# Patient Record
Sex: Female | Born: 1965 | Race: Asian | Hispanic: No | Marital: Married | Smoking: Never smoker
Health system: Southern US, Community
[De-identification: ages and names within clinical notes are randomized; demographics above are authoritative.]

## PROBLEM LIST (undated history)

## (undated) DIAGNOSIS — M8580 Other specified disorders of bone density and structure, unspecified site: Secondary | ICD-10-CM

## (undated) DIAGNOSIS — E785 Hyperlipidemia, unspecified: Secondary | ICD-10-CM

## (undated) DIAGNOSIS — M810 Age-related osteoporosis without current pathological fracture: Secondary | ICD-10-CM

## (undated) HISTORY — PX: EYE SURGERY: SHX253

## (undated) HISTORY — DX: Age-related osteoporosis without current pathological fracture: M81.0

## (undated) HISTORY — DX: Other specified disorders of bone density and structure, unspecified site: M85.80

---

## 2016-03-07 ENCOUNTER — Encounter: Payer: Self-pay | Admitting: Obstetrics & Gynecology

## 2016-06-17 ENCOUNTER — Emergency Department (HOSPITAL_COMMUNITY): Payer: BLUE CROSS/BLUE SHIELD

## 2016-06-17 ENCOUNTER — Encounter (HOSPITAL_COMMUNITY): Payer: Self-pay | Admitting: *Deleted

## 2016-06-17 ENCOUNTER — Emergency Department (HOSPITAL_COMMUNITY)
Admission: EM | Admit: 2016-06-17 | Discharge: 2016-06-17 | Disposition: A | Discharge status: Home / Self Care | Payer: BLUE CROSS/BLUE SHIELD | Attending: Emergency Medicine | Admitting: Emergency Medicine

## 2016-06-17 DIAGNOSIS — Y999 Unspecified external cause status: Secondary | ICD-10-CM | POA: Diagnosis not present

## 2016-06-17 DIAGNOSIS — S299XXA Unspecified injury of thorax, initial encounter: Secondary | ICD-10-CM | POA: Diagnosis present

## 2016-06-17 DIAGNOSIS — S20219A Contusion of unspecified front wall of thorax, initial encounter: Secondary | ICD-10-CM | POA: Insufficient documentation

## 2016-06-17 DIAGNOSIS — Y92410 Unspecified street and highway as the place of occurrence of the external cause: Secondary | ICD-10-CM | POA: Insufficient documentation

## 2016-06-17 DIAGNOSIS — Y939 Activity, unspecified: Secondary | ICD-10-CM | POA: Diagnosis not present

## 2016-06-17 HISTORY — DX: Hyperlipidemia, unspecified: E78.5

## 2016-06-17 MED ORDER — IBUPROFEN 400 MG PO TABS
400.0000 mg | ORAL_TABLET | Freq: Once | ORAL | Status: AC
  Administered 2016-06-17: 400 mg via ORAL
  Filled 2016-06-17: qty 1
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
## 2016-08-20 ENCOUNTER — Encounter: Payer: Self-pay | Admitting: Obstetrics & Gynecology

## 2017-03-04 ENCOUNTER — Encounter: Payer: Self-pay | Admitting: Obstetrics & Gynecology

## 2017-08-12 ENCOUNTER — Encounter: Payer: Self-pay | Admitting: Obstetrics & Gynecology

## 2017-08-12 ENCOUNTER — Telehealth: Payer: Self-pay | Admitting: *Deleted

## 2017-08-12 ENCOUNTER — Ambulatory Visit: Payer: BLUE CROSS/BLUE SHIELD | Admitting: Obstetrics & Gynecology

## 2017-08-12 VITALS — BP 120/76 | Ht 59.5 in | Wt 105.0 lb

## 2017-08-12 DIAGNOSIS — Z1151 Encounter for screening for human papillomavirus (HPV): Secondary | ICD-10-CM

## 2017-08-12 DIAGNOSIS — Z1211 Encounter for screening for malignant neoplasm of colon: Secondary | ICD-10-CM

## 2017-08-12 DIAGNOSIS — Z780 Asymptomatic menopausal state: Secondary | ICD-10-CM

## 2017-08-12 DIAGNOSIS — Z13820 Encounter for screening for osteoporosis: Secondary | ICD-10-CM | POA: Diagnosis not present

## 2017-08-12 DIAGNOSIS — Z01419 Encounter for gynecological examination (general) (routine) without abnormal findings: Secondary | ICD-10-CM

## 2017-08-12 NOTE — Progress Notes (Signed)
Karen Torres [birth year] [id number]   History:    52 y.o. G0  Married.  Works in [employer], [employer] (medical).  RP:  Established patient presenting for annual gyn exam   HPI: Menopause for about 4 years.  On no hormone replacement therapy.  No postmenopausal bleeding.  Normal vaginal secretions.  No pain with intercourse.  Urine and bowel movements normal.  Breasts normal.  Body mass index 20.85.  Walks every day.  Healthy nutrition.  Will follow up here for fasting health labs.  Past medical history,surgical history, family history and social history were all reviewed and documented in the EPIC chart.  Gynecologic History No LMP recorded. Patient is postmenopausal. Contraception: post menopausal status Last Pap: 2018? Results were: normal per patient, will obtain records from [place] mammogram: 02/2017. Results were: Probably benign, f/u scheduled 09/25/2017 Bone Density: Never, will schedule here now Colonoscopy: Never, will refer to [facility]  Obstetric History OB History  Gravida Para Term Preterm AB Living  0 0 0 0 0 0  SAB TAB Ectopic Multiple Live Births  0 0 0 0 0     ROS: A ROS was performed and pertinent positives and negatives are included in the history.  GENERAL: No fevers or chills. HEENT: No change in vision, no earache, sore throat or sinus congestion. NECK: No pain or stiffness. CARDIOVASCULAR: No chest pain or pressure. No palpitations. PULMONARY: No shortness of breath, cough or wheeze. GASTROINTESTINAL: No abdominal pain, nausea, vomiting or diarrhea, melena or bright red blood per rectum. GENITOURINARY: No urinary frequency, urgency, hesitancy or dysuria. MUSCULOSKELETAL: No joint or muscle pain, no back pain, no recent trauma. DERMATOLOGIC: No rash, no itching, no lesions. ENDOCRINE: No polyuria, polydipsia, no heat or cold intolerance. No recent change in weight. HEMATOLOGICAL: No anemia or easy bruising or bleeding. NEUROLOGIC: No headache, seizures, numbness, tingling or  weakness. PSYCHIATRIC: No depression, no loss of interest in normal activity or change in sleep pattern.     Exam:   BP 120/76   Ht 4' 11.5" (1.511 m)   Wt 105 lb (47.6 kg)   BMI 20.85 kg/m   Body mass index is 20.85 kg/m.  General appearance : Well developed well nourished female. No acute distress HEENT: Eyes: no retinal hemorrhage or exudates,  Neck supple, trachea midline, no carotid bruits, no thyroidmegaly Lungs: Clear to auscultation, no rhonchi or wheezes, or rib retractions  Heart: Regular rate and rhythm, no murmurs or gallops Breast:Examined in sitting and supine position were symmetrical in appearance, no palpable masses or tenderness,  no skin retraction, no nipple inversion, no nipple discharge, no skin discoloration, no axillary or supraclavicular lymphadenopathy Abdomen: no palpable masses or tenderness, no rebound or guarding Extremities: no edema or skin discoloration or tenderness  Pelvic: Vulva: Normal             Vagina: No gross lesions or discharge  Cervix: No gross lesions or discharge.  Pap/HPV HR done.  Uterus  AV, normal size, shape and consistency, non-tender and mobile  Adnexa  Without masses or tenderness  Anus: Normal   Assessment/Plan:  52 y.o. female for annual exam   1. Encounter for routine gynecological examination with Papanicolaou smear of cervix Normal gynecologic exam and menopause.  Pap test with high risk HPV done today.  Breast exam normal.  Last screening mammogram in January 2019 showed probably benign results.  Follow-up mammogram at 6 months is scheduled for September 25, 2017.  Will organize first screening colonoscopy with gastroenterology.  Follow-up here for fasting health labs.  Continue with regular fitness and healthy nutrition. - CBC; Future - Comp Met (CMET); Future - TSH; Future - VITAMIN D 25 Hydroxy (Vit-D Deficiency, Fractures); Future - Lipid panel; Future  2. Menopause present Well on no hormone replacement therapy.   No postmenopausal bleeding.    3. Screening for osteoporosis Will follow up here for bone density.  Recommend vitamin D supplements, calcium rich nutrition and regular weightbearing physical activity. - DG Bone Density; Future  Other orders - Multiple Vitamin (MULTIVITAMIN) tablet; Take 1 tablet by mouth daily.  Princess Bruins MD, 8:49 AM 08/12/2017

## 2017-08-12 NOTE — Telephone Encounter (Signed)
Referral placed at [facility] to schedule, they will call her to do so.

## 2017-08-12 NOTE — Telephone Encounter (Signed)
-----   Message from Genia DelMarie-Lyne Lavoie, MD sent at 08/12/2017  9:10 AM EDT ----- Regarding: Refer to [facility] Screening Colonoscopy

## 2017-08-12 NOTE — Addendum Note (Signed)
Addended by: Berna SpareASTILLO, Verina Galeno A on: 08/12/2017 09:30 AM   Modules accepted: Orders

## 2017-08-12 NOTE — Patient Instructions (Signed)
1. Encounter for routine gynecological examination with Papanicolaou smear of cervix Normal gynecologic exam and menopause.  Pap test with high risk HPV done today.  Breast exam normal.  Last screening mammogram in January 2019 showed probably benign results.  Follow-up mammogram at 6 months is scheduled for September 25, 2017.  Will organize first screening colonoscopy with gastroenterology.  Follow-up here for fasting health labs.  Continue with regular fitness and healthy nutrition. - CBC; Future - Comp Met (CMET); Future - TSH; Future - VITAMIN D 25 Hydroxy (Vit-D Deficiency, Fractures); Future - Lipid panel; Future  2. Menopause present Well on no hormone replacement therapy.  No postmenopausal bleeding.    3. Screening for osteoporosis Will follow up here for bone density.  Recommend vitamin D supplements, calcium rich nutrition and regular weightbearing physical activity. - DG Bone Density; Future  Other orders - Multiple Vitamin (MULTIVITAMIN) tablet; Take 1 tablet by mouth daily.  Verenise, it was a pleasure seeing you today!  I will inform you of your results as soon as they are available. 

## 2017-08-14 LAB — PAP, TP IMAGING W/ HPV RNA, RFLX HPV TYPE 16,18/45: HPV DNA HIGH RISK: NOT DETECTED

## 2017-08-14 NOTE — Telephone Encounter (Signed)
Blende said patient will call back to schedule.

## 2017-09-12 ENCOUNTER — Encounter: Payer: Self-pay | Admitting: *Deleted

## 2017-09-15 ENCOUNTER — Other Ambulatory Visit: Payer: Self-pay | Admitting: Gynecology

## 2017-09-15 DIAGNOSIS — Z13820 Encounter for screening for osteoporosis: Secondary | ICD-10-CM

## 2017-09-16 ENCOUNTER — Other Ambulatory Visit: Payer: BLUE CROSS/BLUE SHIELD

## 2017-09-25 ENCOUNTER — Encounter: Payer: Self-pay | Admitting: Obstetrics & Gynecology

## 2017-09-25 ENCOUNTER — Ambulatory Visit (INDEPENDENT_AMBULATORY_CARE_PROVIDER_SITE_OTHER): Payer: BLUE CROSS/BLUE SHIELD

## 2017-09-25 ENCOUNTER — Other Ambulatory Visit: Payer: Self-pay | Admitting: Gynecology

## 2017-09-25 ENCOUNTER — Other Ambulatory Visit: Payer: BLUE CROSS/BLUE SHIELD

## 2017-09-25 DIAGNOSIS — M8589 Other specified disorders of bone density and structure, multiple sites: Secondary | ICD-10-CM

## 2017-09-25 DIAGNOSIS — Z01419 Encounter for gynecological examination (general) (routine) without abnormal findings: Secondary | ICD-10-CM

## 2017-09-25 DIAGNOSIS — M8580 Other specified disorders of bone density and structure, unspecified site: Secondary | ICD-10-CM

## 2017-09-25 DIAGNOSIS — Z13820 Encounter for screening for osteoporosis: Secondary | ICD-10-CM

## 2017-09-25 HISTORY — DX: Other specified disorders of bone density and structure, unspecified site: M85.80

## 2017-09-26 ENCOUNTER — Encounter: Payer: Self-pay | Admitting: Gynecology

## 2017-09-26 LAB — COMPREHENSIVE METABOLIC PANEL
AG RATIO: 1.8 (calc) (ref 1.0–2.5)
ALT: 8 U/L (ref 6–29)
AST: 29 U/L (ref 10–35)
Albumin: 4.6 g/dL (ref 3.6–5.1)
Alkaline phosphatase (APISO): 78 U/L (ref 33–130)
BILIRUBIN TOTAL: 0.7 mg/dL (ref 0.2–1.2)
BUN: 12 mg/dL (ref 7–25)
CALCIUM: 9.8 mg/dL (ref 8.6–10.4)
CO2: 24 mmol/L (ref 20–32)
Chloride: 105 mmol/L (ref 98–110)
Creat: 0.59 mg/dL (ref 0.50–1.05)
GLUCOSE: 80 mg/dL (ref 65–99)
Globulin: 2.6 g/dL (calc) (ref 1.9–3.7)
Potassium: 4.7 mmol/L (ref 3.5–5.3)
SODIUM: 140 mmol/L (ref 135–146)
TOTAL PROTEIN: 7.2 g/dL (ref 6.1–8.1)

## 2017-09-26 LAB — LIPID PANEL
CHOL/HDL RATIO: 3.7 (calc) (ref <5.0)
CHOLESTEROL: 231 mg/dL — AB (ref <200)
HDL: 62 mg/dL (ref >50)
LDL CHOLESTEROL (CALC): 144 mg/dL — AB
Non-HDL Cholesterol (Calc): 169 mg/dL (calc) — ABNORMAL HIGH (ref <130)
Triglycerides: 130 mg/dL (ref <150)

## 2017-09-26 LAB — CBC
HCT: 37.9 % (ref 35.0–45.0)
Hemoglobin: 12.8 g/dL (ref 11.7–15.5)
MCH: 32.2 pg (ref 27.0–33.0)
MCHC: 33.8 g/dL (ref 32.0–36.0)
MCV: 95.2 fL (ref 80.0–100.0)
MPV: 11.2 fL (ref 7.5–12.5)
Platelets: 201 Thousand/uL (ref 140–400)
RBC: 3.98 Million/uL (ref 3.80–5.10)
RDW: 11.8 % (ref 11.0–15.0)
WBC: 4.7 Thousand/uL (ref 3.8–10.8)

## 2017-09-26 LAB — VITAMIN D 25 HYDROXY (VIT D DEFICIENCY, FRACTURES): Vit D, 25-Hydroxy: 23 ng/mL — ABNORMAL LOW (ref 30–100)

## 2017-09-26 LAB — TSH: TSH: 1.45 m[IU]/L

## 2017-10-17 ENCOUNTER — Other Ambulatory Visit: Payer: Self-pay | Admitting: Obstetrics & Gynecology

## 2017-10-17 DIAGNOSIS — E559 Vitamin D deficiency, unspecified: Secondary | ICD-10-CM

## 2017-10-17 MED ORDER — VITAMIN D (ERGOCALCIFEROL) 1.25 MG (50000 UNIT) PO CAPS: 50000.0000 [IU] | ORAL_CAPSULE | ORAL | 0 refills | Status: DC
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
## 2018-01-03 ENCOUNTER — Other Ambulatory Visit: Payer: Self-pay | Admitting: Obstetrics & Gynecology

## 2018-01-03 DIAGNOSIS — E559 Vitamin D deficiency, unspecified: Secondary | ICD-10-CM

## 2018-01-06 ENCOUNTER — Other Ambulatory Visit: Payer: BLUE CROSS/BLUE SHIELD

## 2018-01-06 DIAGNOSIS — E559 Vitamin D deficiency, unspecified: Secondary | ICD-10-CM

## 2018-01-07 LAB — VITAMIN D 25 HYDROXY (VIT D DEFICIENCY, FRACTURES): VIT D 25 HYDROXY: 33 ng/mL (ref 30–100)

## 2018-09-25 ENCOUNTER — Ambulatory Visit (INDEPENDENT_AMBULATORY_CARE_PROVIDER_SITE_OTHER): Payer: BC Managed Care – PPO | Admitting: Obstetrics & Gynecology

## 2018-09-25 ENCOUNTER — Encounter: Payer: Self-pay | Admitting: Obstetrics & Gynecology

## 2018-09-25 ENCOUNTER — Other Ambulatory Visit: Payer: Self-pay

## 2018-09-25 VITALS — Ht 59.5 in | Wt 108.0 lb

## 2018-09-25 DIAGNOSIS — Z780 Asymptomatic menopausal state: Secondary | ICD-10-CM | POA: Diagnosis not present

## 2018-09-25 DIAGNOSIS — Z01419 Encounter for gynecological examination (general) (routine) without abnormal findings: Secondary | ICD-10-CM

## 2018-09-25 NOTE — Progress Notes (Signed)
Karen DancerMaryanti Torres 15-Jan-1966 [id number]   History:    53 y.o. G0 Married.  Works in [employer].  RP:  Established patient presenting for annual gyn exam   HPI: Menopause for about 5 years.  On no hormone replacement therapy.  No postmenopausal bleeding.  Normal vaginal secretions.  Some dryness and pain with intercourse.  Urine and bowel movements normal.  Breasts normal.  Body mass index 21.45.  Walks every day.  Healthy nutrition.  Will establish with a Fam MD for Health Labs.  No colonoscopy done yet.   Past medical history,surgical history, family history and social history were all reviewed and documented in the EPIC chart.  Gynecologic History No LMP recorded. Patient is postmenopausal. Contraception: post menopausal status Last Pap: 07/2017. Results were: Negative/HPV HR Neg Last mammogram: 09/2017. Results were: Benign Bone Density: Never Colonoscopy: Will schedule referral with GastroEnterologist.  Obstetric History OB History  Gravida Para Term Preterm AB Living  0 0 0 0 0 0  SAB TAB Ectopic Multiple Live Births  0 0 0 0 0     ROS: A ROS was performed and pertinent positives and negatives are included in the history.  GENERAL: No fevers or chills. HEENT: No change in vision, no earache, sore throat or sinus congestion. NECK: No pain or stiffness. CARDIOVASCULAR: No chest pain or pressure. No palpitations. PULMONARY: No shortness of breath, cough or wheeze. GASTROINTESTINAL: No abdominal pain, nausea, vomiting or diarrhea, melena or bright red blood per rectum. GENITOURINARY: No urinary frequency, urgency, hesitancy or dysuria. MUSCULOSKELETAL: No joint or muscle pain, no back pain, no recent trauma. DERMATOLOGIC: No rash, no itching, no lesions. ENDOCRINE: No polyuria, polydipsia, no heat or cold intolerance. No recent change in weight. HEMATOLOGICAL: No anemia or easy bruising or bleeding. NEUROLOGIC: No headache, seizures, numbness, tingling or weakness. PSYCHIATRIC: No  depression, no loss of interest in normal activity or change in sleep pattern.     Exam:   Ht 4' 11.5" (1.511 m)   Wt 108 lb (49 kg)   BMI 21.45 kg/m   Body mass index is 21.45 kg/m.  General appearance : Well developed well nourished female. No acute distress HEENT: Eyes: no retinal hemorrhage or exudates,  Neck supple, trachea midline, no carotid bruits, no thyroidmegaly Lungs: Clear to auscultation, no rhonchi or wheezes, or rib retractions  Heart: Regular rate and rhythm, no murmurs or gallops Breast:Examined in sitting and supine position were symmetrical in appearance, no palpable masses or tenderness,  no skin retraction, no nipple inversion, no nipple discharge, no skin discoloration, no axillary or supraclavicular lymphadenopathy Abdomen: no palpable masses or tenderness, no rebound or guarding Extremities: no edema or skin discoloration or tenderness  Pelvic: Vulva: Normal             Vagina: No gross lesions or discharge  Cervix: No gross lesions or discharge  Uterus  AV, normal size, shape and consistency, non-tender and mobile  Adnexa  Without masses or tenderness  Anus: Normal   Assessment/Plan:  53 y.o. female for annual exam   1. Well female exam with routine gynecological exam Normal gynecologic exam and menopause.  Pap test in June 2019 was negative with negative high-risk HPV, no indication to repeat this year.  Breast exam normal.  Screening mammogram August 2019 was benign, patient will schedule now.  No colonoscopy done yet, will refer to gastroenterology to organize.  Will establish with a family physician and do health labs with family physician.  Good body mass  index at 21.45.  Continue with fitness and healthy nutrition.  2. Postmenopause Well on no hormone replacement therapy.  No postmenopausal bleeding.  Continue with vitamin D supplements, calcium intake of 1200 mg daily and regular weightbearing physical activities.  Other orders - cholecalciferol  (VITAMIN D3) 25 MCG (1000 UT) tablet; Take 1,000 Units by mouth daily.  Princess Bruins MD, 11:07 AM 09/25/2018

## 2018-09-25 NOTE — Patient Instructions (Addendum)
1. Well female exam with routine gynecological exam Normal gynecologic exam and menopause.  Pap test in June 2019 was negative with negative high-risk HPV, no indication to repeat this year.  Breast exam normal.  Screening mammogram August 2019 was benign, patient will schedule now.  No colonoscopy done yet, will refer to gastroenterology to organize.  Will establish with a family physician and do health labs with family physician.  Good body mass index at 21.45.  Continue with fitness and healthy nutrition.  2. Postmenopause Well on no hormone replacement therapy.  No postmenopausal bleeding.  Continue with vitamin D supplements, calcium intake of 1200 mg daily and regular weightbearing physical activities.  Other orders - cholecalciferol (VITAMIN D3) 25 MCG (1000 UT) tablet; Take 1,000 Units by mouth daily.  Alaney, it was a pleasure seeing you today!

## 2018-09-29 ENCOUNTER — Telehealth: Payer: Self-pay | Admitting: *Deleted

## 2018-09-29 NOTE — Telephone Encounter (Signed)
-----   Message from Princess Bruins, MD sent at 09/25/2018 11:19 AM EDT ----- Regarding: Refer to [facility] Screening Colonoscopy.    Please also suggest a few Fam MD possibilities, would like to establish care.

## 2018-09-29 NOTE — Telephone Encounter (Signed)
Left message for pt to call to provide name for the below.

## 2018-10-01 ENCOUNTER — Encounter: Payer: Self-pay | Admitting: Obstetrics & Gynecology

## 2018-11-18 ENCOUNTER — Encounter: Payer: Self-pay | Admitting: Gynecology

## 2019-04-08 ENCOUNTER — Encounter: Payer: Self-pay | Admitting: Obstetrics & Gynecology

## 2019-09-30 ENCOUNTER — Encounter: Payer: Self-pay | Admitting: Obstetrics & Gynecology

## 2019-09-30 ENCOUNTER — Other Ambulatory Visit: Payer: Self-pay

## 2019-09-30 ENCOUNTER — Ambulatory Visit (INDEPENDENT_AMBULATORY_CARE_PROVIDER_SITE_OTHER): Payer: BC Managed Care – PPO | Admitting: Obstetrics & Gynecology

## 2019-09-30 VITALS — BP 130/70 | Ht 59.5 in | Wt 110.0 lb

## 2019-09-30 DIAGNOSIS — M8589 Other specified disorders of bone density and structure, multiple sites: Secondary | ICD-10-CM

## 2019-09-30 DIAGNOSIS — Z01419 Encounter for gynecological examination (general) (routine) without abnormal findings: Secondary | ICD-10-CM

## 2019-09-30 DIAGNOSIS — Z780 Asymptomatic menopausal state: Secondary | ICD-10-CM | POA: Diagnosis not present

## 2019-09-30 DIAGNOSIS — N870 Mild cervical dysplasia: Secondary | ICD-10-CM

## 2019-09-30 NOTE — Progress Notes (Signed)
Karen Torres 1965/10/27 [id number]   History:    54 y.o. G0Married. Works in Scientist, research (medical).  [id number] presenting for annual gyn exam   [id number] for about 6 years. On no hormone replacement therapy. No postmenopausal bleeding. Normal vaginal secretions. No pain with IC. Urine and bowel movements normal. Breasts normal. Body mass index 21.85. Walks every day. Healthy nutrition. Will f/u here for fasting Health Labs.  Will refer to [facility] for Colonoscopy.   Past medical history,surgical history, family history and social history were all reviewed and documented in the EPIC chart.  Gynecologic History No LMP recorded. Patient is postmenopausal.  Obstetric History OB History  Gravida Para Term Preterm AB Living  0 0 0 0 0 0  SAB TAB Ectopic Multiple Live Births  0 0 0 0 0     ROS: A ROS was performed and pertinent positives and negatives are included in the history.  GENERAL: No fevers or chills. HEENT: No change in vision, no earache, sore throat or sinus congestion. NECK: No pain or stiffness. CARDIOVASCULAR: No chest pain or pressure. No palpitations. PULMONARY: No shortness of breath, cough or wheeze. GASTROINTESTINAL: No abdominal pain, nausea, vomiting or diarrhea, melena or bright red blood per rectum. GENITOURINARY: No urinary frequency, urgency, hesitancy or dysuria. MUSCULOSKELETAL: No joint or muscle pain, no back pain, no recent trauma. DERMATOLOGIC: No rash, no itching, no lesions. ENDOCRINE: No polyuria, polydipsia, no heat or cold intolerance. No recent change in weight. HEMATOLOGICAL: No anemia or easy bruising or bleeding. NEUROLOGIC: No headache, seizures, numbness, tingling or weakness. PSYCHIATRIC: No depression, no loss of interest in normal activity or change in sleep pattern.     Exam:   BP 130/70   Ht 4' 11.5" (1.511 m)   Wt 110 lb (49.9 kg)   BMI 21.85 kg/m   Body mass index is 21.85 kg/m.  General appearance : Well  developed well nourished female. No acute distress HEENT: Eyes: no retinal hemorrhage or exudates,  Neck supple, trachea midline, no carotid bruits, no thyroidmegaly Lungs: Clear to auscultation, no rhonchi or wheezes, or rib retractions  Heart: Regular rate and rhythm, no murmurs or gallops Breast:Examined in sitting and supine position were symmetrical in appearance, no palpable masses or tenderness,  no skin retraction, no nipple inversion, no nipple discharge, no skin discoloration, no axillary or supraclavicular lymphadenopathy Abdomen: no palpable masses or tenderness, no rebound or guarding Extremities: no edema or skin discoloration or tenderness  Pelvic: Vulva: Normal             Vagina: No gross lesions or discharge  Cervix: No gross lesions or discharge.  Pap reflex done.  Uterus  AV, normal size, shape and consistency, non-tender and mobile  Adnexa  Without masses or tenderness  Anus: Normal   Assessment/Plan:  55 y.o. female for annual exam   1. Encounter for routine gynecological examination with Papanicolaou smear of cervix Normal gyn exam in Menopause.  Pap reflex done today.  Breast exam normal.  Screening Mammo Neg 03/2019.  Refer to [facility] for [employer]  Fasting Health Labs here. - CBC; Future - Comp Met (CMET); Future - TSH; Future - VITAMIN D 25 Hydroxy (Vit-D Deficiency, Fractures); Future - Lipid panel; Future  2. Dysplasia of cervix, low grade (CIN 1) Pap test repeated today.  3. Postmenopause Well on no HRT.  No PMB.  4. Osteopenia of multiple sites BD with Osteopenia at multiple sites in 09/2017, will repeat BD here now.  Vitamin D supplements, calcium  intake of 1200 mg daily and regular weightbearing physical activities. - DG Bone Density; Future  Princess Bruins MD, 9:23 AM 09/30/2019

## 2019-10-01 LAB — PAP IG W/ RFLX HPV ASCU

## 2019-11-09 ENCOUNTER — Encounter: Payer: Self-pay | Admitting: Obstetrics & Gynecology

## 2019-11-16 ENCOUNTER — Other Ambulatory Visit: Payer: BC Managed Care – PPO

## 2019-11-16 ENCOUNTER — Other Ambulatory Visit: Payer: Self-pay

## 2019-11-16 DIAGNOSIS — Z01419 Encounter for gynecological examination (general) (routine) without abnormal findings: Secondary | ICD-10-CM

## 2019-11-17 LAB — CBC
HCT: 39.8 % (ref 35.0–45.0)
Hemoglobin: 13.2 g/dL (ref 11.7–15.5)
MCH: 32.5 pg (ref 27.0–33.0)
MCHC: 33.2 g/dL (ref 32.0–36.0)
MCV: 98 fL (ref 80.0–100.0)
MPV: 11.1 fL (ref 7.5–12.5)
Platelets: 216 10*3/uL (ref 140–400)
RBC: 4.06 10*6/uL (ref 3.80–5.10)
RDW: 11.8 % (ref 11.0–15.0)
WBC: 4.4 10*3/uL (ref 3.8–10.8)

## 2019-11-17 LAB — COMPREHENSIVE METABOLIC PANEL
AG Ratio: 1.6 (calc) (ref 1.0–2.5)
ALT: 8 U/L (ref 6–29)
AST: 22 U/L (ref 10–35)
Albumin: 4.5 g/dL (ref 3.6–5.1)
Alkaline phosphatase (APISO): 83 U/L (ref 37–153)
BUN: 14 mg/dL (ref 7–25)
CO2: 28 mmol/L (ref 20–32)
Calcium: 9.8 mg/dL (ref 8.6–10.4)
Chloride: 104 mmol/L (ref 98–110)
Creat: 0.59 mg/dL (ref 0.50–1.05)
Globulin: 2.8 g/dL (calc) (ref 1.9–3.7)
Glucose, Bld: 86 mg/dL (ref 65–99)
Potassium: 4.3 mmol/L (ref 3.5–5.3)
Sodium: 140 mmol/L (ref 135–146)
Total Bilirubin: 1.1 mg/dL (ref 0.2–1.2)
Total Protein: 7.3 g/dL (ref 6.1–8.1)

## 2019-11-17 LAB — LIPID PANEL
Cholesterol: 241 mg/dL — ABNORMAL HIGH (ref <200)
HDL: 58 mg/dL (ref >=50)
LDL Cholesterol (Calc): 156 mg/dL (calc) — ABNORMAL HIGH
Non-HDL Cholesterol (Calc): 183 mg/dL (calc) — ABNORMAL HIGH (ref <130)
Total CHOL/HDL Ratio: 4.2 (calc) (ref <5.0)
Triglycerides: 144 mg/dL (ref <150)

## 2019-11-17 LAB — TSH: TSH: 1.8 mIU/L (ref 0.40–4.50)

## 2019-11-17 LAB — VITAMIN D 25 HYDROXY (VIT D DEFICIENCY, FRACTURES): Vit D, 25-Hydroxy: 57 ng/mL (ref 30–100)

## 2020-07-18 ENCOUNTER — Other Ambulatory Visit: Payer: Self-pay | Admitting: Emergency Medicine

## 2020-07-18 DIAGNOSIS — G609 Hereditary and idiopathic neuropathy, unspecified: Secondary | ICD-10-CM

## 2020-07-19 ENCOUNTER — Encounter: Payer: Self-pay | Admitting: Neurology

## 2020-07-25 ENCOUNTER — Other Ambulatory Visit: Payer: BLUE CROSS/BLUE SHIELD

## 2020-07-25 ENCOUNTER — Other Ambulatory Visit: Payer: Self-pay

## 2020-07-25 ENCOUNTER — Ambulatory Visit
Admission: RE | Admit: 2020-07-25 | Discharge: 2020-07-25 | Disposition: A | Discharge status: Home / Self Care | Payer: BC Managed Care – PPO | Source: Ambulatory Visit | Attending: Emergency Medicine | Admitting: Emergency Medicine

## 2020-07-25 DIAGNOSIS — G609 Hereditary and idiopathic neuropathy, unspecified: Secondary | ICD-10-CM

## 2020-10-03 ENCOUNTER — Encounter: Payer: Self-pay | Admitting: Obstetrics & Gynecology

## 2020-10-03 ENCOUNTER — Ambulatory Visit (INDEPENDENT_AMBULATORY_CARE_PROVIDER_SITE_OTHER): Payer: BC Managed Care – PPO | Admitting: Obstetrics & Gynecology

## 2020-10-03 ENCOUNTER — Other Ambulatory Visit: Payer: Self-pay

## 2020-10-03 VITALS — BP 120/78 | HR 72 | Resp 16 | Ht 59.25 in | Wt 107.0 lb

## 2020-10-03 DIAGNOSIS — Z780 Asymptomatic menopausal state: Secondary | ICD-10-CM

## 2020-10-03 DIAGNOSIS — M8589 Other specified disorders of bone density and structure, multiple sites: Secondary | ICD-10-CM

## 2020-10-03 DIAGNOSIS — Z01419 Encounter for gynecological examination (general) (routine) without abnormal findings: Secondary | ICD-10-CM

## 2020-10-03 NOTE — Progress Notes (Signed)
Karen Torres 1965/08/30 [id number]   History:    55 y.o.  G0 Married.  Husband had Cardiac surgery.  Patient works in Engineering geologist.   RP:  Established patient presenting for annual gyn exam   HPI: Postmenopause, well on no hormone replacement therapy.  No postmenopausal bleeding.  Normal vaginal secretions.  No pain with IC.  Urine and bowel movements normal.  Breasts normal.  Body mass index 21.43.  Walks every day.  Healthy nutrition.  Will do Health Labs with Fam MD.  Declines Colono, will order ColoGard.   Past medical history,surgical history, family history and social history were all reviewed and documented in the EPIC chart.  Gynecologic History No LMP recorded. Patient is postmenopausal.  Obstetric History OB History  Gravida Para Term Preterm AB Living  0 0 0 0 0 0  SAB IAB Ectopic Multiple Live Births  0 0 0 0 0     ROS: A ROS was performed and pertinent positives and negatives are included in the history.  GENERAL: No fevers or chills. HEENT: No change in vision, no earache, sore throat or sinus congestion. NECK: No pain or stiffness. CARDIOVASCULAR: No chest pain or pressure. No palpitations. PULMONARY: No shortness of breath, cough or wheeze. GASTROINTESTINAL: No abdominal pain, nausea, vomiting or diarrhea, melena or bright red blood per rectum. GENITOURINARY: No urinary frequency, urgency, hesitancy or dysuria. MUSCULOSKELETAL: No joint or muscle pain, no back pain, no recent trauma. DERMATOLOGIC: No rash, no itching, no lesions. ENDOCRINE: No polyuria, polydipsia, no heat or cold intolerance. No recent change in weight. HEMATOLOGICAL: No anemia or easy bruising or bleeding. NEUROLOGIC: No headache, seizures, numbness, tingling or weakness. PSYCHIATRIC: No depression, no loss of interest in normal activity or change in sleep pattern.     Exam:   BP 120/78   Pulse 72   Resp 16   Ht 4' 11.25" (1.505 m)   Wt 107 lb (48.5 kg)   BMI 21.43 kg/m   Body mass index is  21.43 kg/m.  General appearance : Well developed well nourished female. No acute distress HEENT: Eyes: no retinal hemorrhage or exudates,  Neck supple, trachea midline, no carotid bruits, no thyroidmegaly Lungs: Clear to auscultation, no rhonchi or wheezes, or rib retractions  Heart: Regular rate and rhythm, no murmurs or gallops Breast:Examined in sitting and supine position were symmetrical in appearance, no palpable masses or tenderness,  no skin retraction, no nipple inversion, no nipple discharge, no skin discoloration, no axillary or supraclavicular lymphadenopathy Abdomen: no palpable masses or tenderness, no rebound or guarding Extremities: no edema or skin discoloration or tenderness  Pelvic: Vulva: Normal             Vagina: No gross lesions or discharge  Cervix: No gross lesions or discharge  Uterus  AV, normal size, shape and consistency, non-tender and mobile  Adnexa  Without masses or tenderness  Anus: Normal   Assessment/Plan:  55 y.o. female for annual exam   1. Well female exam with routine gynecological exam Normal gynecologic exam in menopause.  No indication for Pap test this year.  Last Pap test August 2021 was negative.  Breast exam normal.  Screening mammogram September 2021 was negative.  Declines colonoscopy, Cologuard ordered.  Will do health labs with family physician. - Cologuard  2. Postmenopause Well on no hormone replacement therapy.  No postmenopausal bleeding.  3. Osteopenia of multiple sites Bone density in August 2019 showed osteopenia.  We will repeat a bone density here  now.  Vitamin D supplements, calcium intake of 1.5 g/day total and regular weightbearing physical activity is recommended. - DG Bone Density; Future  Other orders - ibuprofen (ADVIL) 800 MG tablet; Take 800 mg by mouth 3 (three) times daily as needed.   Genia Del MD, 9:30 AM 10/03/2020

## 2020-10-07 ENCOUNTER — Encounter: Payer: Self-pay | Admitting: Obstetrics & Gynecology

## 2020-10-09 NOTE — Progress Notes (Signed)
NEUROLOGY CONSULTATION NOTE  Karen Torres MRN: [id number] DOB: 01-16-1966  Referring provider: Roxy Horseman, MD (Urgent Care) Primary care provider: No PCP  Reason for consult:  headache, right sided numbness  Assessment/Plan:   1  Transient right sided numbness and tingling - on a couple of subsequent occasions, she had it briefly in the right arm, raising possibility of radiculopathy.  However, I cannot explain involvement of both right upper and lower extremities back in May, other than possible transient ischemic attack 2  Hyperlipidemia  Recommend starting ASA 81mg  daily Lipid panel last year revealed elevated total chol over 200 and LDL 156.  Will repeat lipid panel as well as CMP.  Will likely start statin.  I advised that she establish care with a PCP for ongoing management. Normotensive blood pressure Check carotid ultrasound Follow up 6 months.   Subjective:  Karen Torres is a 55 year old right-handed female who presents for headache and right sided numbness.  History supplemented by referring provider's note.  In early May 2022, she began experiencing sudden onset tingling of the right arm and leg.  No weakness, visual disturbance, headache or speech disturbance.  Symptoms lasted a couple of minutes.  She was seen in Urgent Care later that month.  MRI of brain without contrast on 07/25/2020 personally reviewed showed mild chronic small vessel ischemic changes within the bilateral cerebral hemispheres but no acute abnormalities to explain her symptoms. Once in awhile she will feel tingling in his right arm, sometimes involving the hand.  It may occur if she is working at the computer.  No neck, back or radicular pain.     Current analgesic/NSAID:  ibuprofen 800mg    PAST MEDICAL HISTORY: Past Medical History:  Diagnosis Date   Hyperlipemia    Osteopenia 09/2017   T score -2.3 fax 3% / 0.3%    PAST SURGICAL HISTORY: Past Surgical History:  Procedure Laterality  Date   EYE SURGERY      MEDICATIONS: Current Outpatient Medications on File Prior to Visit  Medication Sig Dispense Refill   cholecalciferol (VITAMIN D3) 25 MCG (1000 UT) tablet Take 1,000 Units by mouth daily.     ibuprofen (ADVIL) 800 MG tablet Take 800 mg by mouth 3 (three) times daily as needed.     Multiple Vitamin (MULTIVITAMIN) tablet Take 1 tablet by mouth daily.     No current facility-administered medications on file prior to visit.    ALLERGIES: No Known Allergies  FAMILY HISTORY: Family History  Problem Relation Age of Onset   Breast cancer Mother    Cancer Father        tumor on neck     Objective:  Blood pressure 116/66, pulse 80, height 4\' 11"  (1.499 m), weight 107 lb 3.2 oz (48.6 kg), SpO2 100 %. General: No acute distress.  Patient appears well-groomed.   Head:  Normocephalic/atraumatic Eyes:  fundi examined but not visualized Neck: supple, no paraspinal tenderness, full range of motion Back: No paraspinal tenderness Heart: regular rate and rhythm Lungs: Clear to auscultation bilaterally. Vascular: No carotid bruits. Neurological Exam: Mental status: alert and oriented to person, place, and time, recent and remote memory intact, fund of knowledge intact, attention and concentration intact, speech fluent and not dysarthric, language intact. Cranial nerves: CN I: not tested CN II: pupils equal, round and reactive to light, visual fields intact CN III, IV, VI:  full range of motion, no nystagmus, no ptosis CN V: facial sensation intact. CN VII: upper and lower  face symmetric CN VIII: hearing intact CN IX, X: gag intact, uvula midline CN XI: sternocleidomastoid and trapezius muscles intact CN XII: tongue midline Bulk & Tone: normal, no fasciculations. Motor:  muscle strength 5/5 throughout Sensation:  Pinprick, temperature and vibratory sensation intact. Deep Tendon Reflexes:  2+ throughout,  toes downgoing.   Finger to nose testing:  Without dysmetria.    Heel to shin:  Without dysmetria.   Gait:  Normal station and stride.  Romberg negative.    Thank you for allowing me to take part in the care of this patient.  Shon Millet, DO

## 2020-10-10 ENCOUNTER — Ambulatory Visit: Payer: BC Managed Care – PPO | Admitting: Neurology

## 2020-10-10 ENCOUNTER — Encounter: Payer: Self-pay | Admitting: Neurology

## 2020-10-10 ENCOUNTER — Other Ambulatory Visit: Payer: Self-pay

## 2020-10-10 ENCOUNTER — Other Ambulatory Visit (INDEPENDENT_AMBULATORY_CARE_PROVIDER_SITE_OTHER): Payer: BC Managed Care – PPO

## 2020-10-10 VITALS — BP 116/66 | HR 80 | Ht 59.0 in | Wt 107.2 lb

## 2020-10-10 DIAGNOSIS — E785 Hyperlipidemia, unspecified: Secondary | ICD-10-CM

## 2020-10-10 DIAGNOSIS — R200 Anesthesia of skin: Secondary | ICD-10-CM

## 2020-10-10 LAB — COMPREHENSIVE METABOLIC PANEL
ALT: 9 U/L (ref 0–35)
AST: 21 U/L (ref 0–37)
Albumin: 4.6 g/dL (ref 3.5–5.2)
Alkaline Phosphatase: 92 U/L (ref 39–117)
BUN: 13 mg/dL (ref 6–23)
CO2: 30 mEq/L (ref 19–32)
Calcium: 10.2 mg/dL (ref 8.4–10.5)
Chloride: 104 mEq/L (ref 96–112)
Creatinine, Ser: 0.55 mg/dL (ref 0.40–1.20)
GFR: 101.62 mL/min (ref >60.00)
Glucose, Bld: 86 mg/dL (ref 70–99)
Potassium: 4.7 mEq/L (ref 3.5–5.1)
Sodium: 140 mEq/L (ref 135–145)
Total Bilirubin: 0.8 mg/dL (ref 0.2–1.2)
Total Protein: 7.7 g/dL (ref 6.0–8.3)

## 2020-10-10 LAB — LIPID PANEL
Cholesterol: 216 mg/dL — ABNORMAL HIGH (ref 0–200)
HDL: 51.8 mg/dL (ref >39.00)
NonHDL: 164.1
Total CHOL/HDL Ratio: 4
Triglycerides: 237 mg/dL — ABNORMAL HIGH (ref 0.0–149.0)
VLDL: 47.4 mg/dL — ABNORMAL HIGH (ref 0.0–40.0)

## 2020-10-10 LAB — LDL CHOLESTEROL, DIRECT: Direct LDL: 146 mg/dL

## 2020-10-10 NOTE — Progress Notes (Signed)
Tried calling pt, no answer lmovm to call the office back.

## 2020-10-10 NOTE — Patient Instructions (Signed)
I would treat you as if you had a small stroke since I have no other explanation Start taking aspirin 81mg  daily Check lipid panel.  Will likely start a statin/cholesterol-lowering medication Check carotid ultrasound Establish care with a primary care provider to continue management of cholesterol. Follow up 6 months.

## 2020-10-13 ENCOUNTER — Telehealth: Payer: Self-pay | Admitting: Neurology

## 2020-10-13 DIAGNOSIS — E785 Hyperlipidemia, unspecified: Secondary | ICD-10-CM

## 2020-10-13 MED ORDER — ATORVASTATIN CALCIUM 40 MG PO TABS: 40.0000 mg | ORAL_TABLET | Freq: Every day | ORAL | 1 refills | Status: AC

## 2020-10-13 NOTE — Telephone Encounter (Signed)
Pt is returning a call to brittany.

## 2020-10-13 NOTE — Telephone Encounter (Addendum)
See result note.  

## 2020-10-13 NOTE — Telephone Encounter (Signed)
Patient notified of labs and verbalized understanding.  No further questions at this time. Prescription atorvastatin 40 mg sent to [facility] per request.  Will have repeat lipid panel a week prior to next appointment on 04/13/21.  Ordered.  Also sent patient link via email to sign up for [facility] MyChart.

## 2020-10-20 ENCOUNTER — Ambulatory Visit
Admission: RE | Admit: 2020-10-20 | Discharge: 2020-10-20 | Disposition: A | Discharge status: Home / Self Care | Payer: BC Managed Care – PPO | Source: Ambulatory Visit | Attending: Neurology | Admitting: Neurology

## 2020-10-20 DIAGNOSIS — R200 Anesthesia of skin: Secondary | ICD-10-CM

## 2020-10-20 DIAGNOSIS — E785 Hyperlipidemia, unspecified: Secondary | ICD-10-CM

## 2020-10-25 NOTE — Progress Notes (Signed)
Patient advised of cartoid results, voiced understanding and thanked me for calling.

## 2020-11-16 ENCOUNTER — Encounter: Payer: Self-pay | Admitting: Obstetrics & Gynecology

## 2020-12-05 ENCOUNTER — Telehealth: Payer: Self-pay

## 2020-12-05 NOTE — Telephone Encounter (Signed)
I called patient to remind her to do her cologuard & mail it back in.

## 2021-04-20 ENCOUNTER — Ambulatory Visit: Payer: BC Managed Care – PPO | Admitting: Neurology

## 2021-08-29 DIAGNOSIS — K648 Other hemorrhoids: Secondary | ICD-10-CM | POA: Diagnosis not present

## 2021-08-29 DIAGNOSIS — D124 Benign neoplasm of descending colon: Secondary | ICD-10-CM | POA: Diagnosis not present

## 2021-08-29 DIAGNOSIS — K5730 Diverticulosis of large intestine without perforation or abscess without bleeding: Secondary | ICD-10-CM | POA: Diagnosis not present

## 2021-08-29 DIAGNOSIS — Z09 Encounter for follow-up examination after completed treatment for conditions other than malignant neoplasm: Secondary | ICD-10-CM | POA: Diagnosis not present

## 2021-08-29 DIAGNOSIS — Z86010 Personal history of colonic polyps: Secondary | ICD-10-CM | POA: Diagnosis not present

## 2021-10-05 ENCOUNTER — Encounter: Payer: Self-pay | Admitting: Obstetrics & Gynecology

## 2021-10-05 ENCOUNTER — Ambulatory Visit (INDEPENDENT_AMBULATORY_CARE_PROVIDER_SITE_OTHER): Payer: BC Managed Care – PPO | Admitting: Obstetrics & Gynecology

## 2021-10-05 VITALS — BP 98/62 | HR 71 | Ht 59.0 in | Wt 106.0 lb

## 2021-10-05 DIAGNOSIS — Z01419 Encounter for gynecological examination (general) (routine) without abnormal findings: Secondary | ICD-10-CM | POA: Diagnosis not present

## 2021-10-05 DIAGNOSIS — Z780 Asymptomatic menopausal state: Secondary | ICD-10-CM | POA: Diagnosis not present

## 2021-10-05 DIAGNOSIS — M8589 Other specified disorders of bone density and structure, multiple sites: Secondary | ICD-10-CM

## 2021-10-05 NOTE — Progress Notes (Signed)
Karen Torres Jul 29, 1965 [id number]   History:    56 y.o.  G0 Married.  Husband had Cardiac surgery.  Patient works in Engineering geologist.   RP:  Established patient presenting for annual gyn exam   HPI: Postmenopause, well on no hormone replacement therapy.  No postmenopausal bleeding.  Normal vaginal secretions.  No pain with IC.  Pap Neg 09/2019.  No h/o abnormal Pap.  Will repeat Pap at 3 years next year.  Urine and bowel movements normal.  Colono Benign Polyps removed 08/2021, repeat at 3 yrs.  Breasts normal. Mammo 10/2020 Neg.  Body mass index 21.41.  Walks every day.  Healthy nutrition.  BD Osteopenia AP Spine -2.3 in 09/2017.  Repeat BD here now.  Will do Health Labs with Fam MD.      Past medical history,surgical history, family history and social history were all reviewed and documented in the EPIC chart.  Gynecologic History No LMP recorded. Patient is postmenopausal.  Obstetric History OB History  Gravida Para Term Preterm AB Living  0 0 0 0 0 0  SAB IAB Ectopic Multiple Live Births  0 0 0 0 0     ROS: A ROS was performed and pertinent positives and negatives are included in the history. GENERAL: No fevers or chills. HEENT: No change in vision, no earache, sore throat or sinus congestion. NECK: No pain or stiffness. CARDIOVASCULAR: No chest pain or pressure. No palpitations. PULMONARY: No shortness of breath, cough or wheeze. GASTROINTESTINAL: No abdominal pain, nausea, vomiting or diarrhea, melena or bright red blood per rectum. GENITOURINARY: No urinary frequency, urgency, hesitancy or dysuria. MUSCULOSKELETAL: No joint or muscle pain, no back pain, no recent trauma. DERMATOLOGIC: No rash, no itching, no lesions. ENDOCRINE: No polyuria, polydipsia, no heat or cold intolerance. No recent change in weight. HEMATOLOGICAL: No anemia or easy bruising or bleeding. NEUROLOGIC: No headache, seizures, numbness, tingling or weakness. PSYCHIATRIC: No depression, no loss of interest in normal  activity or change in sleep pattern.     Exam:   BP 98/62   Pulse 71   Ht 4\' 11"  (1.499 m)   Wt 106 lb (48.1 kg)   SpO2 99%   BMI 21.41 kg/m   Body mass index is 21.41 kg/m.  General appearance : Well developed well nourished female. No acute distress HEENT: Eyes: no retinal hemorrhage or exudates,  Neck supple, trachea midline, no carotid bruits, no thyroidmegaly Lungs: Clear to auscultation, no rhonchi or wheezes, or rib retractions  Heart: Regular rate and rhythm, no murmurs or gallops Breast:Examined in sitting and supine position were symmetrical in appearance, no palpable masses or tenderness,  no skin retraction, no nipple inversion, no nipple discharge, no skin discoloration, no axillary or supraclavicular lymphadenopathy Abdomen: no palpable masses or tenderness, no rebound or guarding Extremities: no edema or skin discoloration or tenderness  Pelvic: Vulva: Normal             Vagina: No gross lesions or discharge  Cervix: No gross lesions or discharge  Uterus  AV, normal size, shape and consistency, non-tender and mobile  Adnexa  Without masses or tenderness  Anus: Normal   Assessment/Plan:  56 y.o. female for annual exam   1. Well female exam with routine gynecological exam Postmenopause, well on no hormone replacement therapy.  No postmenopausal bleeding.  Normal vaginal secretions.  No pain with IC.  Pap Neg 09/2019.  No h/o abnormal Pap.  Will repeat Pap at 3 years next year.  Urine and  bowel movements normal.  Colono Benign Polyps removed 08/2021, repeat at 5 yrs.  Breasts normal. Mammo 10/2020 Neg.  Body mass index 21.41.  Walks every day.  Healthy nutrition.  BD Osteopenia AP Spine -2.3 in 09/2017.  Repeat BD here now.  Will do Health Labs with Fam MD.    2. Postmenopause Postmenopause, well on no hormone replacement therapy.  No postmenopausal bleeding.  Normal vaginal secretions.  No pain with IC. - DG Bone Density; Future  3. Osteopenia of multiple  sites Body mass index 21.41.  Walks every day.  Healthy nutrition.  BD Osteopenia AP Spine -2.3 in 09/2017.  Repeat BD here now.  Will do Health Labs with Fam MD.  - DG Bone Density; Future   Genia Del MD, 9:09 AM 10/05/2021

## 2021-10-30 ENCOUNTER — Other Ambulatory Visit: Payer: Self-pay | Admitting: Obstetrics & Gynecology

## 2021-10-30 ENCOUNTER — Ambulatory Visit (INDEPENDENT_AMBULATORY_CARE_PROVIDER_SITE_OTHER): Payer: BC Managed Care – PPO

## 2021-10-30 DIAGNOSIS — Z780 Asymptomatic menopausal state: Secondary | ICD-10-CM | POA: Diagnosis not present

## 2021-10-30 DIAGNOSIS — Z13820 Encounter for screening for osteoporosis: Secondary | ICD-10-CM

## 2021-10-30 DIAGNOSIS — M8589 Other specified disorders of bone density and structure, multiple sites: Secondary | ICD-10-CM

## 2021-10-30 DIAGNOSIS — M810 Age-related osteoporosis without current pathological fracture: Secondary | ICD-10-CM

## 2021-11-14 DIAGNOSIS — T63481A Toxic effect of venom of other arthropod, accidental (unintentional), initial encounter: Secondary | ICD-10-CM | POA: Diagnosis not present

## 2021-11-21 ENCOUNTER — Encounter: Payer: Self-pay | Admitting: Obstetrics & Gynecology

## 2021-11-21 DIAGNOSIS — Z1231 Encounter for screening mammogram for malignant neoplasm of breast: Secondary | ICD-10-CM | POA: Diagnosis not present

## 2021-11-23 ENCOUNTER — Telehealth: Payer: Self-pay | Admitting: *Deleted

## 2021-11-23 NOTE — Telephone Encounter (Signed)
Patient called and left message in triage voicemail requesting a call back. I called and received voicemail, left message to call.

## 2022-01-09 DIAGNOSIS — E785 Hyperlipidemia, unspecified: Secondary | ICD-10-CM | POA: Diagnosis not present

## 2022-01-09 DIAGNOSIS — Z0000 Encounter for general adult medical examination without abnormal findings: Secondary | ICD-10-CM | POA: Diagnosis not present

## 2022-01-09 DIAGNOSIS — D7589 Other specified diseases of blood and blood-forming organs: Secondary | ICD-10-CM | POA: Diagnosis not present

## 2022-01-16 ENCOUNTER — Telehealth: Payer: Self-pay | Admitting: *Deleted

## 2022-01-16 NOTE — Telephone Encounter (Signed)
Spoke with patient. Patient request to schedule BMD consult from 10/2021 BMD imaging. Patient is requesting 8am appt after 02/18/22. OV scheduled for 02/27/22 at 0800 with Dr. Seymour Bars.   Routing to provider for final review. Patient is agreeable to disposition. Will close encounter.

## 2022-02-27 ENCOUNTER — Ambulatory Visit: Payer: BC Managed Care – PPO | Admitting: Obstetrics & Gynecology

## 2022-02-27 ENCOUNTER — Encounter: Payer: Self-pay | Admitting: Obstetrics & Gynecology

## 2022-02-27 VITALS — BP 104/64 | HR 62

## 2022-02-27 DIAGNOSIS — M810 Age-related osteoporosis without current pathological fracture: Secondary | ICD-10-CM

## 2022-02-27 DIAGNOSIS — Z780 Asymptomatic menopausal state: Secondary | ICD-10-CM | POA: Diagnosis not present

## 2022-02-27 NOTE — Progress Notes (Signed)
    Karen Torres 1965/07/10 [id number]        57 y.o.  G0P0000   RP: Counseling and management of Osteoporosis  HPI: BD showing Osteoporosis on 10/30/2021. Postmenopause x age 85, well on no hormone replacement therapy.  No postmenopausal bleeding.  No pain with IC. Body mass index 21.41 at Annual Gyn visit 09/2021.  Walks every day.  Good balance, no recent fall.  No h/o fragility fracture. Healthy nutrition with mild products.  Taking Vit D supplements and added Vit K2 recently.  BD Osteopenia AP Spine -2.3 in 09/2017 and the repeat BD showed a significant decrease at the AP Spine now in Osteoporosis in 10/2021.    OB History  Gravida Para Term Preterm AB Living  0 0 0 0 0 0  SAB IAB Ectopic Multiple Live Births  0 0 0 0 0    Past medical history,surgical history, problem list, medications, allergies, family history and social history were all reviewed and documented in the EPIC chart.   Directed ROS with pertinent positives and negatives documented in the history of present illness/assessment and plan.  Exam:  Vitals:   02/27/22 0757  BP: 104/64  Pulse: 62  SpO2: 99%   General appearance:  Normal  Bone Density 10/30/2021:  Osteoporosis AP Spine T-Score -2.7.  Significant decrease in BD at the AP Spine and Lt total Hip x 2019.   Assessment/Plan:  57 y.o. G0P0000   1. Age-related osteoporosis without current pathological fracture BD showing Osteoporosis on 10/30/2021. Postmenopause x age 69, well on no hormone replacement therapy.  No postmenopausal bleeding.  No pain with IC. Body mass index 21.41 at Annual Gyn visit 09/2021.  Walks every day.  Good balance, no recent fall.  No h/o fragility fracture. Healthy nutrition with mild products.  Taking Vit D supplements and added Vit K2 recently.  BD Osteopenia AP Spine -2.3 in 09/2017 and the repeat BD showed a significant decrease at the AP Spine now in Osteoporosis in 10/2021.  Recommend increasing weight bearing physical activities to  every day.  Ca++ total 1200-1500 mg daily.  Continue with Vit D and Vit K2 100 microgram daily supplement.  Recommend Bone Medication with Alendronate 70 mg Po weekly.  Usage/Risks/Benefits discussed.  Patient declined at this time.  Current high risk of fragility fractures discussed.  Will repeat a Bone Density in 2 years and manage according to that result.  2. Postmenopause  Postmenopause x age 65, on no HRT.  Princess Bruins MD, 8:06 AM 02/27/2022

## 2022-04-21 IMAGING — US US CAROTID DUPLEX BILAT
1 series · 13 of 24 positions shown · non-contrast
Comparison: None.

CLINICAL DATA: Hyperlipidemia, right-sided numbness

EXAM:
BILATERAL CAROTID DUPLEX ULTRASOUND
TECHNIQUE: Gray scale imaging, color Doppler and duplex ultrasound were
performed of bilateral carotid and vertebral arteries in the neck.

[Series 1: us carotid duplex bilat · 0.05mm/px · 13 of 58 slices shown]
[im 1/58]
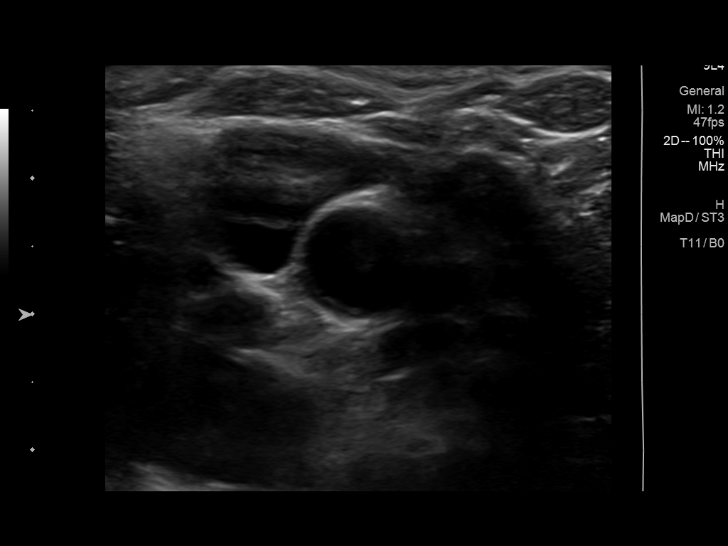
[im 5/58]
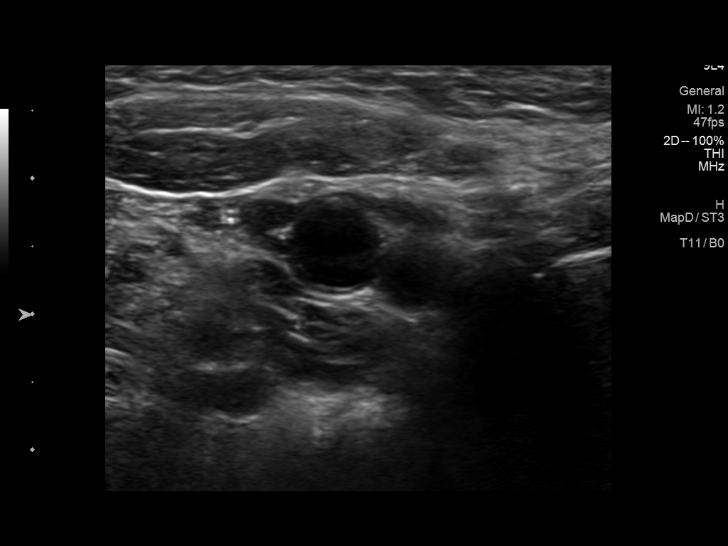
[im 10/58]
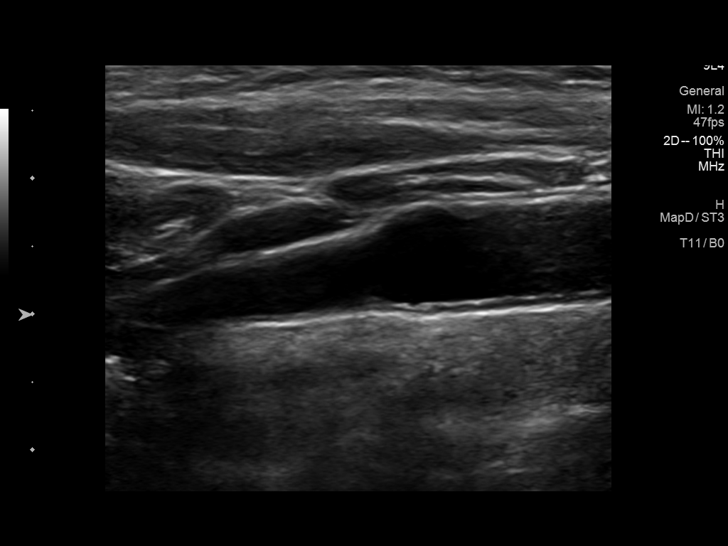
[im 15/58]
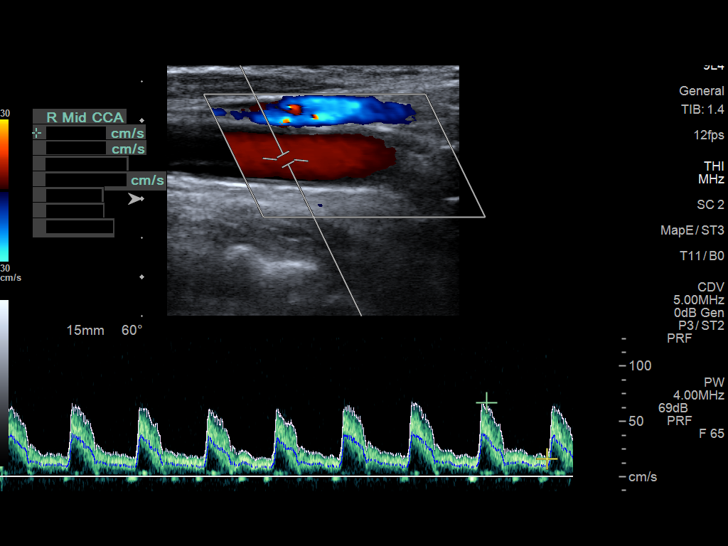
[im 20/58]
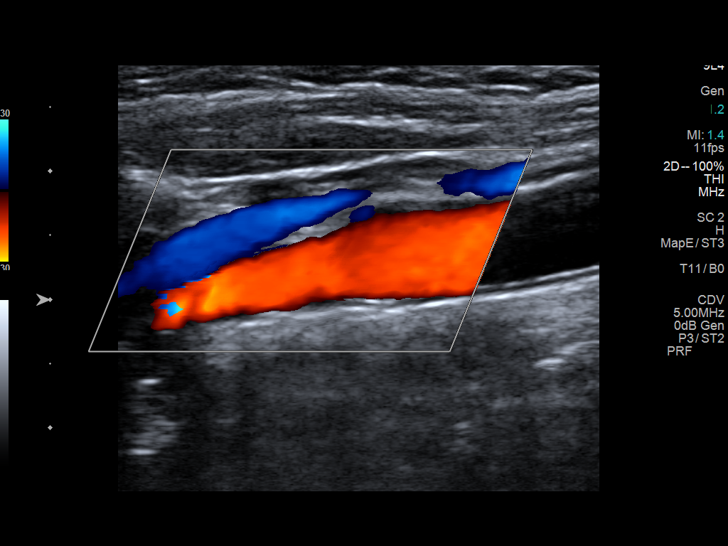
[im 25/58]
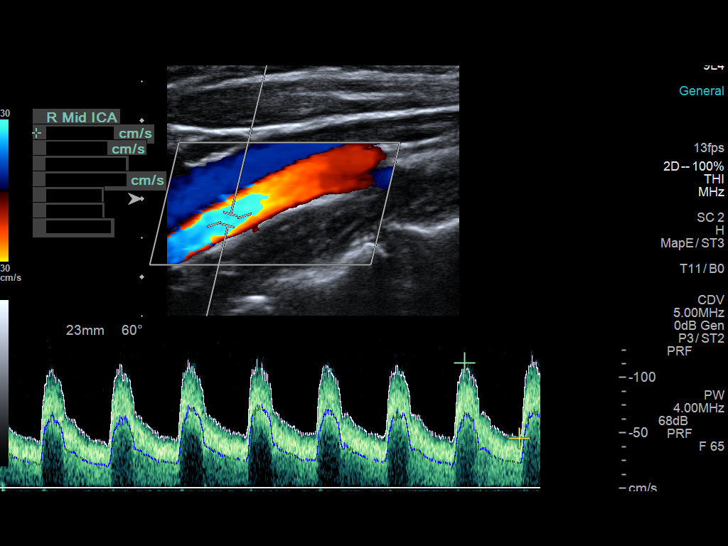
[im 30/58]
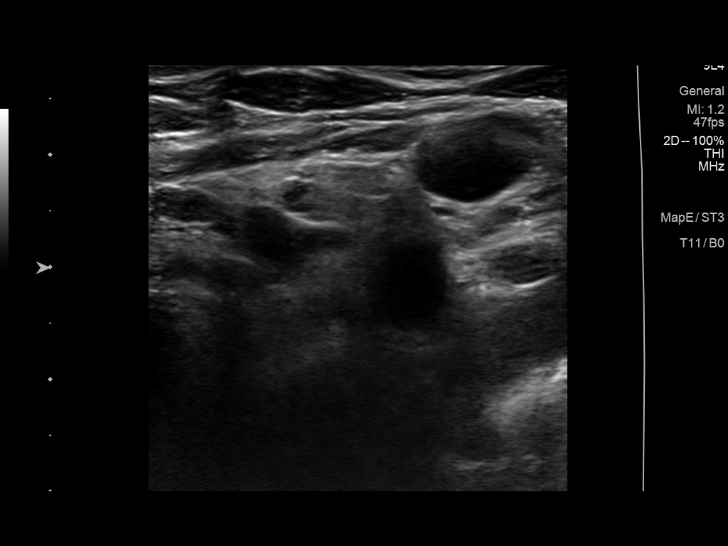
[im 33/58]
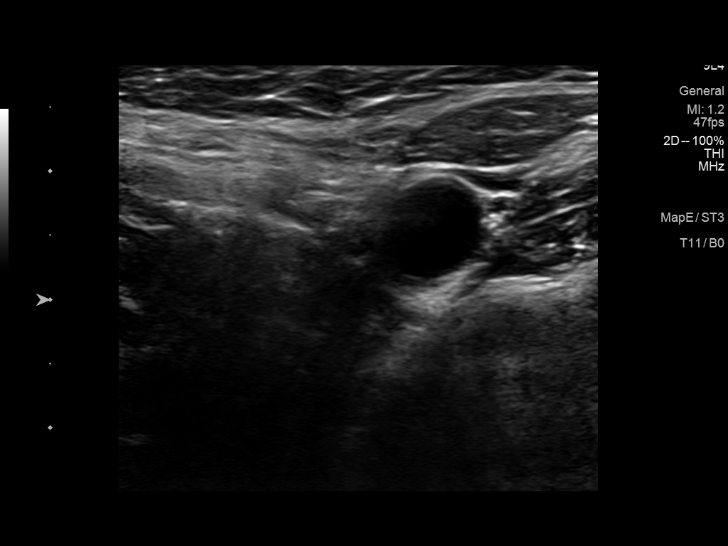
[im 38/58]
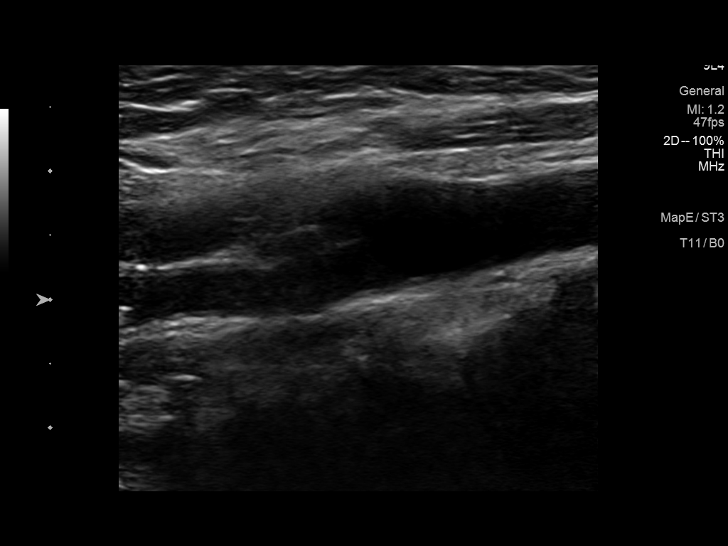
[im 43/58]
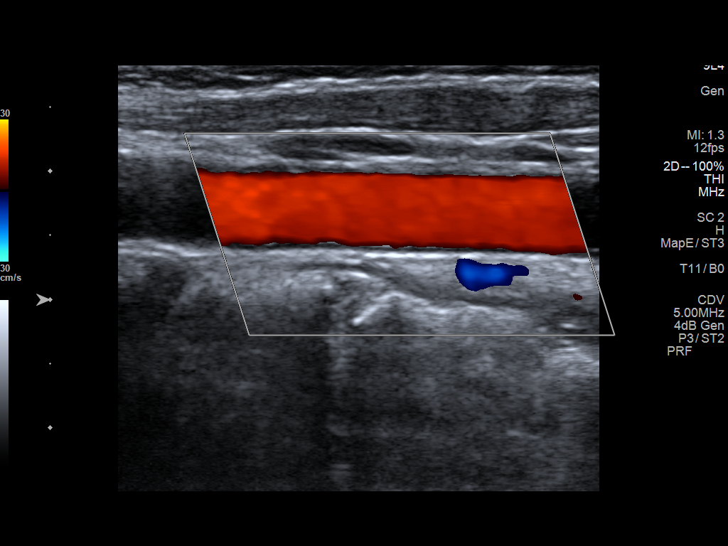
[im 48/58]
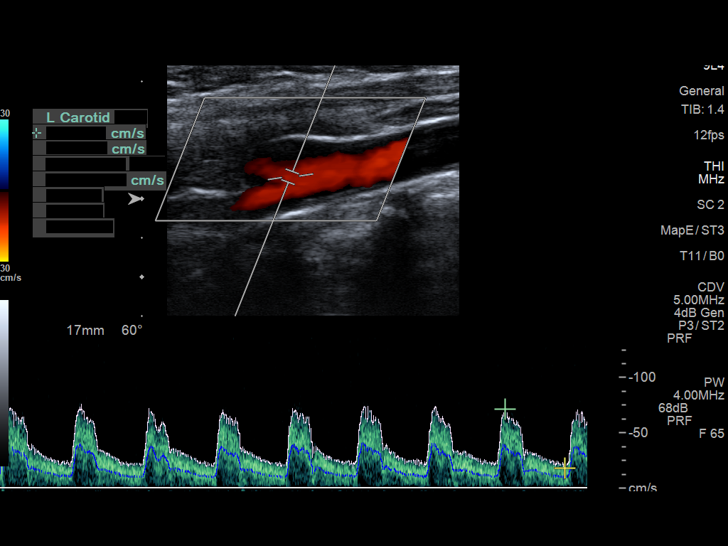
[im 53/58]
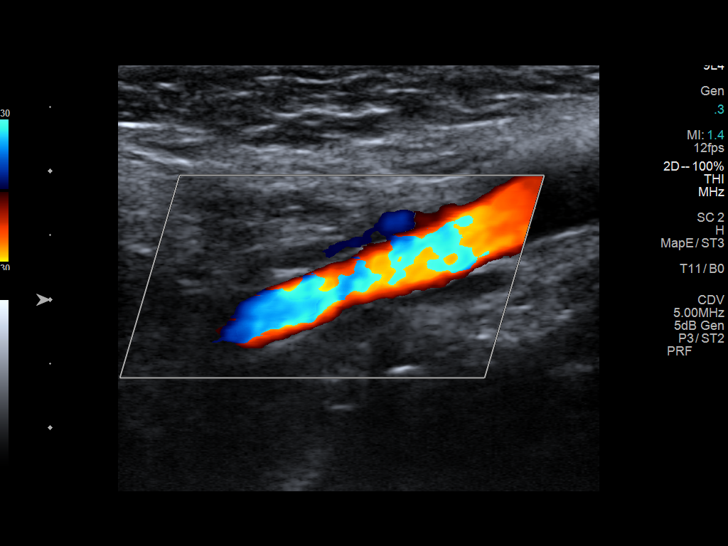
[im 58/58]
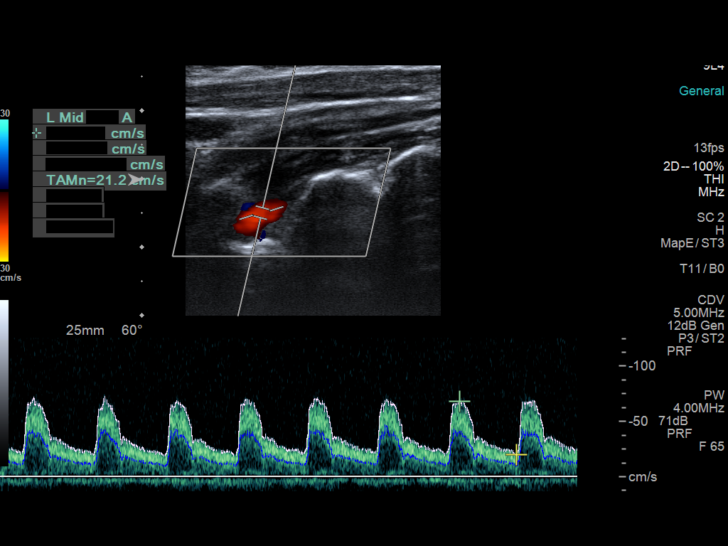

[13 of 24 positions shown; findings below may reference images not displayed]

FINDINGS: Criteria: Quantification of carotid stenosis is based on velocity
parameters that correlate the residual internal carotid diameter
with NASCET-based stenosis levels, using the diameter of the distal
internal carotid lumen as the denominator for stenosis measurement.

The following velocity measurements were obtained:

RIGHT

ICA: 120/50 cm/sec

CCA: 78/7 cm/sec

SYSTOLIC ICA/CCA RATIO:

ECA: 74 cm/sec

LEFT

ICA: 107/41 cm/sec

CCA: 86/28 cm/sec

SYSTOLIC ICA/CCA RATIO:

ECA: 83 cm/sec

RIGHT CAROTID ARTERY: Minor intimal thickening without significant
atherosclerotic plaque formation. No hemodynamically significant
right ICA stenosis, velocity elevation, or turbulent flow. Degree of
narrowing less than 50%.

RIGHT VERTEBRAL ARTERY:  Normal antegrade flow

LEFT CAROTID ARTERY: Similar minor intimal thickening without
significant atherosclerotic plaque formation. No hemodynamically
significant left ICA stenosis, velocity elevation, or turbulent
flow.

LEFT VERTEBRAL ARTERY:  Normal antegrade flow

Upper extremity blood pressures: RIGHT: 113/61 LEFT: 110/59
IMPRESSION: Minor carotid intimal thickening without significant
atherosclerosis. Negative for stenosis.

Patent antegrade vertebral flow bilaterally

## 2022-10-10 ENCOUNTER — Ambulatory Visit: Payer: BC Managed Care – PPO | Admitting: Obstetrics & Gynecology

## 2022-11-26 ENCOUNTER — Ambulatory Visit: Payer: BC Managed Care – PPO | Admitting: Radiology

## 2022-12-06 DIAGNOSIS — Z1231 Encounter for screening mammogram for malignant neoplasm of breast: Secondary | ICD-10-CM | POA: Diagnosis not present

## 2022-12-11 ENCOUNTER — Encounter: Payer: Self-pay | Admitting: Obstetrics and Gynecology

## 2022-12-27 ENCOUNTER — Encounter: Payer: BC Managed Care – PPO | Admitting: Radiology

## 2023-03-26 ENCOUNTER — Ambulatory Visit: Payer: BC Managed Care – PPO | Admitting: Radiology

## 2023-04-30 ENCOUNTER — Ambulatory Visit (INDEPENDENT_AMBULATORY_CARE_PROVIDER_SITE_OTHER): Payer: BC Managed Care – PPO | Admitting: Radiology

## 2023-04-30 ENCOUNTER — Encounter: Payer: Self-pay | Admitting: Radiology

## 2023-04-30 ENCOUNTER — Other Ambulatory Visit (HOSPITAL_COMMUNITY)
Admission: RE | Admit: 2023-04-30 | Discharge: 2023-04-30 | Disposition: A | Discharge status: Home / Self Care | Source: Ambulatory Visit | Attending: Radiology | Admitting: Radiology

## 2023-04-30 VITALS — BP 102/60 | HR 74 | Ht 59.25 in | Wt 107.0 lb

## 2023-04-30 DIAGNOSIS — N958 Other specified menopausal and perimenopausal disorders: Secondary | ICD-10-CM | POA: Diagnosis not present

## 2023-04-30 DIAGNOSIS — Z1331 Encounter for screening for depression: Secondary | ICD-10-CM

## 2023-04-30 DIAGNOSIS — Z01419 Encounter for gynecological examination (general) (routine) without abnormal findings: Secondary | ICD-10-CM | POA: Insufficient documentation

## 2023-04-30 DIAGNOSIS — M810 Age-related osteoporosis without current pathological fracture: Secondary | ICD-10-CM | POA: Diagnosis not present

## 2023-04-30 MED ORDER — ESTRADIOL 10 MCG VA TABS: 1.0000 | ORAL_TABLET | VAGINAL | 11 refills | Status: AC

## 2023-04-30 NOTE — Progress Notes (Signed)
   Nashley Cordoba Oct 15, 1965 [id number]   History: Postmenopausal 58 y.o. presents for annual exam. Diagnosed with osteoporosis 2023, declined medication treatment, taking calcium and vitamin D and exercising. C/o vaginal dryness. Not sexually active.    Gynecologic History Postmenopausal Last Pap: 2021. Results were: normal Last mammogram: 2024. Results were: normal Last colonoscopy: 7/23 DEXA:2023 osteoporosis  Obstetric History OB History  Gravida Para Term Preterm AB Living  0 0 0 0 0 0  SAB IAB Ectopic Multiple Live Births  0 0 0 0 0       04/30/2023   10:44 AM  Depression screen PHQ 2/9  Decreased Interest 1  Down, Depressed, Hopeless 0  PHQ - 2 Score 1     The following portions of the patient's history were reviewed and updated as appropriate: allergies, current medications, past family history, past medical history, past social history, past surgical history, and problem list.  Review of Systems Pertinent items noted in HPI and remainder of comprehensive ROS otherwise negative.  Past medical history, past surgical history, family history and social history were all reviewed and documented in the EPIC chart.  Exam:  Vitals:   04/30/23 1042  BP: 102/60  Pulse: 74  SpO2: 97%  Weight: 107 lb (48.5 kg)  Height: 4' 11.25" (1.505 m)   Body mass index is 21.43 kg/m.  General appearance:  Normal Thyroid:  Symmetrical, normal in size, without palpable masses or nodularity. Respiratory  Auscultation:  Clear without wheezing or rhonchi Cardiovascular  Auscultation:  Regular rate, without rubs, murmurs or gallops  Edema/varicosities:  Not grossly evident Abdominal  Soft,nontender, without masses, guarding or rebound.  Liver/spleen:  No organomegaly noted  Hernia:  None appreciated  Skin  Inspection:  Grossly normal Breasts: Examined lying and sitting.   Right: Without masses, retractions, nipple discharge or axillary adenopathy.   Left: Without masses,  retractions, nipple discharge or axillary adenopathy. Genitourinary   Inguinal/mons:  Normal without inguinal adenopathy  External genitalia:  Normal appearing vulva with no masses, tenderness, or lesions  BUS/Urethra/Skene's glands:  Normal  Vagina:  Normal appearing with normal color and discharge, no lesions. Atrophy: moderate   Cervix:  Normal appearing without discharge or lesions  Uterus:  Normal in size, shape and contour.  Midline and mobile, nontender  Adnexa/parametria:     Rt: Normal in size, without masses or tenderness.   Lt: Normal in size, without masses or tenderness.  Anus and perineum: Normal    Raynelle Fanning, CMA present for exam  Assessment/Plan:   1. Well woman exam with routine gynecological exam (Primary) - Cytology - PAP( [place])  2. Age-related osteoporosis without current pathological fracture - DG Bone Density; Future  3. Genitourinary syndrome of menopause - Estradiol (YUVAFEM) 10 MCG TABS vaginal tablet; Place 1 tablet (10 mcg total) vaginally 2 (two) times a week.  Dispense: 8 tablet; Refill: 11   Discussed SBE, colonoscopy and DEXA screening as directed. Recommend of exercise weekly, including weight bearing exercise. Encouraged the use of seatbelts and sunscreen.  Return in 1 year for annual or sooner prn.  Tanda Rockers WHNP-BC, 10:56 AM 04/30/2023

## 2023-04-30 NOTE — Patient Instructions (Signed)
 Preventive Care 16-60 Years Old, Female  Preventive care refers to lifestyle choices and visits with your health care provider that can promote health and wellness. Preventive care visits are also called wellness exams.  What can I expect for my preventive care visit?  Counseling  Your health care provider may ask you questions about your:  Medical history, including:  Past medical problems.  Family medical history.  Pregnancy history.  Current health, including:  Menstrual cycle.  Method of birth control.  Emotional well-being.  Home life and relationship well-being.  Sexual activity and sexual health.  Lifestyle, including:  Alcohol, nicotine or tobacco, and drug use.  Access to firearms.  Diet, exercise, and sleep habits.  Work and work Astron[employer].  Sunscreen use.  Safety issues such as seatbelt and bike helmet use.  Physical exam  Your health care provider will check your:  Height and weight. These may be used to calculate your BMI (body mass index). BMI is a measurement that tells if you are at a healthy weight.  Waist circumference. This measures the distance around your waistline. This measurement also tells if you are at a healthy weight and may help predict your risk of certain diseases, such as type 2 diabetes and high blood pressure.  Heart rate and blood pressure.  Body temperature.  Skin for abnormal spots.  What immunizations do I need?    Vaccines are usually given at various ages, according to a schedule. Your health care provider will recommend vaccines for you based on your age, medical history, and lifestyle or other factors, such as travel or where you work.  What tests do I need?  Screening  Your health care provider may recommend screening tests for certain conditions. This may include:  Lipid and cholesterol levels.  Diabetes screening. This is done by checking your blood sugar (glucose) after you have not eaten for a while (fasting).  Pelvic exam and Pap test.  Hepatitis B test.  Hepatitis C  test.  HIV (human immunodeficiency virus) test.  STI (sexually transmitted infection) testing, if you are at risk.  Lung cancer screening.  Colorectal cancer screening.  Mammogram. Talk with your health care provider about when you should start having regular mammograms. This may depend on whether you have a family history of breast cancer.  BRCA-related cancer screening. This may be done if you have a family history of breast, ovarian, tubal, or peritoneal cancers.  Bone density scan. This is done to screen for osteoporosis.  Talk with your health care provider about your test results, treatment options, and if necessary, the need for more tests.  Follow these instructions at home:  Eating and drinking    Eat a diet that includes fresh fruits and vegetables, whole grains, lean protein, and low-fat dairy products.  Take vitamin and mineral supplements as recommended by your health care provider.  Do not drink alcohol if:  Your health care provider tells you not to drink.  You are pregnant, may be pregnant, or are planning to become pregnant.  If you drink alcohol:  Limit how much you have to 0-1 drink a day.  Know how much alcohol is in your drink. In the [place] one drink equals one 12 oz bottle of beer (355 mL), one 5 oz glass of wine (148 mL), or one 1 oz glass of hard liquor (44 mL).  Lifestyle  Brush your teeth every morning and night with fluoride toothpaste. Floss one time each day.  Exercise for at least  30 minutes 5 or more days each week.  Do not use any products that contain nicotine or tobacco. These products include cigarettes, chewing tobacco, and vaping devices, such as e-cigarettes. If you need help quitting, ask your health care provider.  Do not use drugs.  If you are sexually active, practice safe sex. Use a condom or other form of protection to prevent STIs.  If you do not wish to become pregnant, use a form of birth control. If you plan to become pregnant, see your health care provider for a  prepregnancy visit.  Take aspirin only as told by your health care provider. Make sure that you understand how much to take and what form to take. Work with your health care provider to find out whether it is safe and beneficial for you to take aspirin daily.  Find healthy ways to manage stress, such as:  Meditation, yoga, or listening to music.  Journaling.  Talking to a trusted person.  Spending time with friends and family.  Minimize exposure to UV radiation to reduce your risk of skin cancer.  Safety  Always wear your seat belt while driving or riding in a vehicle.  Do not drive:  If you have been drinking alcohol. Do not ride with someone who has been drinking.  When you are tired or distracted.  While texting.  If you have been using any mind-altering substances or drugs.  Wear a helmet and other protective equipment during sports activities.  If you have firearms in your house, make sure you follow all gun safety procedures.  Seek help if you have been physically or sexually abused.  What's next?  Visit your health care provider once a year for an annual wellness visit.  Ask your health care provider how often you should have your eyes and teeth checked.  Stay up to date on all vaccines.  This information is not intended to replace advice given to you by your health care provider. Make sure you discuss any questions you have with your health care provider.  Document Revised: 08/09/2020 Document Reviewed: 08/09/2020  [employer]vier Patient Education  2024 [employer].

## 2023-05-02 LAB — CYTOLOGY - PAP
Comment: NEGATIVE
Diagnosis: NEGATIVE
High risk HPV: NEGATIVE

## 2023-12-12 LAB — HM MAMMOGRAPHY

## 2023-12-15 ENCOUNTER — Encounter: Payer: Self-pay | Admitting: Radiology

## 2024-04-30 ENCOUNTER — Ambulatory Visit: Admitting: Radiology
